# Patient Record
Sex: Male | Born: 2005 | Hispanic: No | Marital: Single
Health system: Southern US, Community
[De-identification: ages and names within clinical notes are randomized; demographics above are authoritative.]

## PROBLEM LIST (undated history)

## (undated) DIAGNOSIS — F419 Anxiety disorder, unspecified: Secondary | ICD-10-CM

---
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
## 2024-06-30 ENCOUNTER — Encounter (INDEPENDENT_AMBULATORY_CARE_PROVIDER_SITE_OTHER): Payer: Self-pay | Admitting: *Deleted

## 2024-07-08 ENCOUNTER — Ambulatory Visit (INDEPENDENT_AMBULATORY_CARE_PROVIDER_SITE_OTHER): Admitting: Gastroenterology

## 2024-07-08 ENCOUNTER — Other Ambulatory Visit (INDEPENDENT_AMBULATORY_CARE_PROVIDER_SITE_OTHER): Payer: Self-pay | Admitting: Gastroenterology

## 2024-07-08 ENCOUNTER — Encounter (INDEPENDENT_AMBULATORY_CARE_PROVIDER_SITE_OTHER): Payer: Self-pay | Admitting: Gastroenterology

## 2024-07-08 VITALS — BP 122/79 | HR 81 | Temp 97.1°F | Ht 78.0 in | Wt 297.9 lb

## 2024-07-08 DIAGNOSIS — K7401 Hepatic fibrosis, early fibrosis: Secondary | ICD-10-CM | POA: Diagnosis not present

## 2024-07-08 DIAGNOSIS — K7581 Nonalcoholic steatohepatitis (NASH): Secondary | ICD-10-CM | POA: Diagnosis not present

## 2024-07-08 DIAGNOSIS — K7400 Hepatic fibrosis, unspecified: Secondary | ICD-10-CM | POA: Insufficient documentation

## 2024-07-08 NOTE — Progress Notes (Signed)
 Toribio Fortune, M.D. Gastroenterology & Hepatology [facility] Gastroenterology [address] [place], [place] [address] Primary Care Physician: Jolee Elsie RAMAN, [place] [address] [place] [address]  Referring MD: PCP  Chief Complaint:  MASH with fibrosis  History of Present Illness: Edward Townsend is a 18 y.o. 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  The patient denies having any nausea, vomiting, fever, chills, hematochezia, melena, hematemesis, abdominal distention, abdominal pain, diarrhea, jaundice, pruritus . Patient reports that he believes he has gained close to 30 lb for the last 6 months.  Patient states that since 2022 he was found to have elevated LFTs.  Patient has tried to drinking more water. Drinks sodas regularly, but tries to drink diet versions. States that his mother has tried to [facility] at night. However, he has been going out with this friends recently and has been eating out frequently - lot of fast food.  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 He hopes that by packing his meals he will be able to eat healthier.  Notably, the patient was seen at the gastroenterology clinic at [facility] (Dr. Uvaldo). Per notes the patient had the following workup: FIB-4 Calculation: 0.35 at 01/13/2024 4:32 PM Fibroscan:F1 fibrosis Biopsy:confirmed MASH (F2 fibrosis)  Patient was advised to increase physical activity, avoid alcohol and to decrease intake of carbohydrates in diet.  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  Patient has already received vaccination against hepatitis A and B.  Serologies in January 2025 were positive for antibodies.  Negative hepatitis C antibody.  Labs from 01/12/2023 showed a total bilirubin of 0.4, AST 67, ALT 135, alkaline phos 71, creatinine 0.86,  BUN 11, sodium 141, potassium 4.3, INR 1.0  Last ZHI:wzczm Last Colonoscopy:never  FHx: neg for any gastrointestinal/liver disease, father colon cancer diagnosed at age 81. Social: neg smoking, alcohol or illicit drug use Surgical: no abdominal surgeries  Past Medical History: Past Medical History:  Diagnosis Date   Anxiety     Past Surgical History: Past Surgical History:  Procedure Laterality Date   CIRCUMCISION, NON-NEWBORN     mother states pt was 41 years old   CLOSED REDUCTION RADIAL SHAFT Left 12/21/2018   Procedure: CLOSED REDUCTION RADIAL AND ULNA SHAFT FRACTURE  WITH MANIPULATION;  Surgeon: Shari Toribio, MD;  Location: [facility];  Service: Orthopedics;  Laterality: Left;    Family History: Family History  Problem Relation Age of Onset   Diabetes Maternal Grandmother    Hypertension Maternal Grandfather     Social History: Social History   Tobacco Use  Smoking Status Passive Smoke Exposure - Never Smoker  Smokeless Tobacco Never   Social History   Substance and Sexual Activity  Alcohol Use Never   Social History   Substance and Sexual Activity  Drug Use Not on file    Allergies: Allergies  Allergen Reactions   Amoxicillin Hives    Medications: Current Outpatient Medications  Medication Sig Dispense Refill   Melatonin 5 MG CAPS Take by mouth. 5-10 mg prn qhs     No current facility-administered medications for this visit.    Review of Systems: GENERAL: negative for malaise, night sweats HEENT: No changes in hearing or vision, no nose bleeds or other nasal problems. NECK: Negative for lumps, goiter, pain and significant  neck swelling RESPIRATORY: Negative for cough, wheezing CARDIOVASCULAR: Negative for chest pain, leg swelling, palpitations, orthopnea GI: SEE HPI MUSCULOSKELETAL: Negative for joint pain or swelling, back pain, and muscle pain. SKIN: Negative for lesions, rash PSYCH: Negative for sleep disturbance, mood  disorder and recent psychosocial stressors. HEMATOLOGY Negative for prolonged bleeding, bruising easily, and swollen nodes. ENDOCRINE: Negative for cold or heat intolerance, polyuria, polydipsia and goiter. NEURO: negative for tremor, gait imbalance, syncope and seizures. The remainder of the review of systems is noncontributory.   Physical Exam: BP 122/79 (BP Location: Right Arm, Patient Position: Sitting, Cuff Size: Large)   Pulse 81   Temp (!) 97.1 F (36.2 C) (Temporal)   Ht 6' 6 (1.981 m)   Wt 297 lb 14.4 oz (135.1 kg)   BMI 34.43 kg/m  GENERAL: The patient is AO x3, in no acute distress.  Obese. HEENT: Head is normocephalic and atraumatic. EOMI are intact. Mouth is well hydrated and without lesions. NECK: Supple. No masses LUNGS: Clear to auscultation. No presence of rhonchi/wheezing/rales. Adequate chest expansion HEART: RRR, normal s1 and s2. ABDOMEN: Soft, nontender, no guarding, no peritoneal signs, and nondistended. BS +. No masses. EXTREMITIES: Without any cyanosis, clubbing, rash, lesions or edema. NEUROLOGIC: AOx3, no focal motor deficit. SKIN: no jaundice, no rashes   Imaging/Labs: as above  I personally reviewed and interpreted the available labs, imaging and endoscopic files.  Impression and Plan: Edward Townsend is a 18 y.o. 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  Patient has had chronically elevated LFTs for which he underwent liver biopsy in the past that showed presence of at least F2 fibrosis due to [facility].  He is not taking any medications that could cause drug induced liver injury.  We discussed the importance of preventing further progression of his liver disease by losing weight, especially by decreasing his intake of carbohydrates and fat, ideally focusing on a Mediterranean diet.  We discussed the possible complications for uncontrolled MASH including cirrhosis and HCC.  Will obtain surveillance  labs today.  If on follow-up he is presenting persistently elevated LFTs, we will need to discuss starting Rezdiffra (I discussed this with the patient during the encounter today as well).  Patient is immune to hepatitis A and B, does not need any further vaccinations for now.  -Explained to the patient the etiology and consequences of his current liver disease. Patient was counseled about the benefit of implementing a Mediterranean diet and exercise plan to decrease at least 5% of weight. The patient understood about the importance of lifestyle changes to potentially reverse his liver involvement. -Can increase intake of coffee. -Check CBC and CMP -Avoid alcohol use  All questions were answered.      Toribio Fortune, MD Gastroenterology and Hepatology [facility] Gastroenterology

## 2024-07-08 NOTE — Patient Instructions (Addendum)
 Explained to the patient the etiology and consequences of his current liver disease. Patient was counseled about the benefit of implementing a Mediterranean diet and exercise plan to decrease at least 5% of weight. The patient understood about the importance of lifestyle changes to potentially reverse his liver involvement. Can increase intake of coffee. Perform blood workup Avoid alcohol use

## 2024-07-09 ENCOUNTER — Ambulatory Visit (INDEPENDENT_AMBULATORY_CARE_PROVIDER_SITE_OTHER): Payer: Self-pay | Admitting: Gastroenterology

## 2024-07-09 LAB — COMPREHENSIVE METABOLIC PANEL WITH GFR
AG Ratio: 1.4 (calc) (ref 1.0–2.5)
ALT: 172 U/L — ABNORMAL HIGH (ref 8–46)
AST: 81 U/L — ABNORMAL HIGH (ref 12–32)
Albumin: 4.9 g/dL (ref 3.6–5.1)
Alkaline phosphatase (APISO): 72 U/L (ref 46–169)
BUN: 16 mg/dL (ref 7–20)
CO2: 26 mmol/L (ref 20–32)
Calcium: 9.9 mg/dL (ref 8.9–10.4)
Chloride: 102 mmol/L (ref 98–110)
Creat: 0.72 mg/dL (ref 0.60–1.24)
Globulin: 3.5 g/dL (ref 2.1–3.5)
Glucose, Bld: 101 mg/dL — ABNORMAL HIGH (ref 65–99)
Potassium: 4.1 mmol/L (ref 3.8–5.1)
Sodium: 137 mmol/L (ref 135–146)
Total Bilirubin: 0.7 mg/dL (ref 0.2–1.1)
Total Protein: 8.4 g/dL — ABNORMAL HIGH (ref 6.3–8.2)
eGFR: 136 mL/min/1.73m2 (ref >=60)

## 2024-07-09 LAB — CBC WITH DIFFERENTIAL/PLATELET
Absolute Lymphocytes: 2279 {cells}/uL (ref 1200–5200)
Absolute Monocytes: 585 {cells}/uL (ref 200–900)
Basophils Absolute: 22 {cells}/uL (ref 0–200)
Basophils Relative: 0.3 %
Eosinophils Absolute: 74 {cells}/uL (ref 15–500)
Eosinophils Relative: 1 %
HCT: 46.1 % (ref 36.0–49.0)
Hemoglobin: 15.8 g/dL (ref 12.0–16.9)
MCH: 28.5 pg (ref 25.0–35.0)
MCHC: 34.3 g/dL (ref 31.0–36.0)
MCV: 83.2 fL (ref 78.0–98.0)
MPV: 10.6 fL (ref 7.5–12.5)
Monocytes Relative: 7.9 %
Neutro Abs: 4440 {cells}/uL (ref 1800–8000)
Neutrophils Relative %: 60 %
Platelets: 303 Thousand/uL (ref 140–400)
RBC: 5.54 Million/uL (ref 4.10–5.70)
RDW: 13 % (ref 11.0–15.0)
Total Lymphocyte: 30.8 %
WBC: 7.4 Thousand/uL (ref 4.5–13.0)

## 2024-11-03 ENCOUNTER — Encounter (INDEPENDENT_AMBULATORY_CARE_PROVIDER_SITE_OTHER): Payer: Self-pay | Admitting: Gastroenterology

## 2024-12-02 ENCOUNTER — Other Ambulatory Visit (INDEPENDENT_AMBULATORY_CARE_PROVIDER_SITE_OTHER): Payer: Self-pay

## 2024-12-02 DIAGNOSIS — K7400 Hepatic fibrosis, unspecified: Secondary | ICD-10-CM

## 2024-12-02 DIAGNOSIS — K7581 Nonalcoholic steatohepatitis (NASH): Secondary | ICD-10-CM

## 2025-01-13 ENCOUNTER — Encounter (INDEPENDENT_AMBULATORY_CARE_PROVIDER_SITE_OTHER): Payer: Self-pay | Admitting: Gastroenterology
